# Patient Record
Sex: Female | Born: 1970 | Hispanic: Yes | Marital: Single | Smoking: Never smoker
Health system: Southern US, Community
[De-identification: ages and names within clinical notes are randomized; demographics above are authoritative.]

## PROBLEM LIST (undated history)

## (undated) DIAGNOSIS — I10 Essential (primary) hypertension: Secondary | ICD-10-CM

---

## 2014-06-16 HISTORY — PX: KNEE SURGERY: SHX244

## 2017-10-19 ENCOUNTER — Other Ambulatory Visit: Payer: Self-pay

## 2017-10-19 ENCOUNTER — Emergency Department (HOSPITAL_COMMUNITY)
Admission: EM | Admit: 2017-10-19 | Discharge: 2017-10-19 | Disposition: A | Discharge status: Home / Self Care | Payer: No Typology Code available for payment source | Attending: Emergency Medicine | Admitting: Emergency Medicine

## 2017-10-19 ENCOUNTER — Encounter (HOSPITAL_COMMUNITY): Payer: Self-pay | Admitting: Obstetrics and Gynecology

## 2017-10-19 ENCOUNTER — Emergency Department (HOSPITAL_COMMUNITY): Payer: No Typology Code available for payment source

## 2017-10-19 DIAGNOSIS — M542 Cervicalgia: Secondary | ICD-10-CM | POA: Diagnosis not present

## 2017-10-19 DIAGNOSIS — M25511 Pain in right shoulder: Secondary | ICD-10-CM | POA: Diagnosis not present

## 2017-10-19 DIAGNOSIS — M25561 Pain in right knee: Secondary | ICD-10-CM | POA: Diagnosis not present

## 2017-10-19 DIAGNOSIS — Y939 Activity, unspecified: Secondary | ICD-10-CM | POA: Insufficient documentation

## 2017-10-19 DIAGNOSIS — Y929 Unspecified place or not applicable: Secondary | ICD-10-CM | POA: Diagnosis not present

## 2017-10-19 DIAGNOSIS — M25562 Pain in left knee: Secondary | ICD-10-CM | POA: Diagnosis not present

## 2017-10-19 DIAGNOSIS — I10 Essential (primary) hypertension: Secondary | ICD-10-CM | POA: Diagnosis not present

## 2017-10-19 DIAGNOSIS — Z041 Encounter for examination and observation following transport accident: Secondary | ICD-10-CM | POA: Diagnosis present

## 2017-10-19 DIAGNOSIS — Y999 Unspecified external cause status: Secondary | ICD-10-CM | POA: Insufficient documentation

## 2017-10-19 HISTORY — DX: Essential (primary) hypertension: I10

## 2017-10-19 LAB — URINALYSIS, ROUTINE W REFLEX MICROSCOPIC
BACTERIA UA: NONE SEEN
Bilirubin Urine: NEGATIVE
Glucose, UA: NEGATIVE mg/dL
Ketones, ur: NEGATIVE mg/dL
Leukocytes, UA: NEGATIVE
Nitrite: NEGATIVE
Protein, ur: NEGATIVE mg/dL
SPECIFIC GRAVITY, URINE: 1.01 (ref 1.005–1.030)
pH: 6 (ref 5.0–8.0)

## 2017-10-19 LAB — POC URINE PREG, ED: PREG TEST UR: NEGATIVE

## 2017-10-19 MED ORDER — METHOCARBAMOL 500 MG PO TABS: 500.0000 mg | ORAL_TABLET | Freq: Every evening | ORAL | 0 refills | Status: AC | PRN

## 2017-10-19 MED ORDER — NAPROXEN 500 MG PO TABS: 500.0000 mg | ORAL_TABLET | Freq: Two times a day (BID) | ORAL | 0 refills | Status: AC

## 2017-10-19 MED ORDER — HYDROCODONE-ACETAMINOPHEN 5-325 MG PO TABS
1.0000 | ORAL_TABLET | Freq: Once | ORAL | Status: AC
  Administered 2017-10-19: 1 via ORAL
  Filled 2017-10-19: qty 1

## 2017-10-19 NOTE — ED Notes (Signed)
Pt assisted to Bathroom to provide urinalysis.
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
## 2017-10-19 NOTE — Discharge Instructions (Addendum)
Toma naproxen 2 veces cada dia con comida.  [place] robaxin para relajar los musculos. Ten cuidado, puede causar cansado.  El dolor va a continua por algunos dias. Debe empezar a Programme researcher, broadcasting/film/video.  Regresa a la sala de emergencia si no puede ver, si esta vomitando, si el dolor peoran mucho, si no puede mover la pierna o brazo, o con otros sinmas nuevos.   Take naproxen 2 times a day with meals.  Do not take other anti-inflammatories at the same time open (Advil, Motrin, ibuprofen, Aleve). You may supplement with Tylenol if you need further pain control. Use robaxin as needed for muscle stiffness or soreness.  Have caution, this may make you tired or groggy.  Do not drive or operate heavy machinery while taking this medicine. Use ice packs or heating pads if this helps control your pain. You likely have continued muscle stiffness and soreness over the next couple days.   Return to the emergency room if you develop vision changes, vomiting, slurred speech, numbness, loss of bowel or bladder control, or any new or worsening symptoms.

## 2017-10-19 NOTE — ED Notes (Signed)
Patient transported to CT 
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
## 2024-04-26 NOTE — Progress Notes (Incomplete)
   New Patient Pulmonology Office Visit   Subjective:  Patient ID: Courtney Chase, female    DOB: 06-Feb-1971  MRN: [id number]  Referred by: No ref. provider found  CC: No chief complaint on file.   HPI Courtney Chase is a 53 y.o. female with ***   {PULM QUESTIONNAIRES (Optional):33196}  ROS  Allergies: Patient has no allergy information on record.  Current Outpatient Medications:    methocarbamol  (ROBAXIN ) 500 MG tablet, Take 1 tablet (500 mg total) by mouth at bedtime as needed for muscle spasms., Disp: 10 tablet, Rfl: 0   naproxen  (NAPROSYN ) 500 MG tablet, Take 1 tablet (500 mg total) by mouth 2 (two) times daily with a meal., Disp: 30 tablet, Rfl: 0 Past Medical History:  Diagnosis Date   Hypertension    Past Surgical History:  Procedure Laterality Date   KNEE SURGERY Left 2016   No family history on file. Social History   Socioeconomic History   Marital status: Single    Spouse name: Not on file   Number of children: Not on file   Years of education: Not on file   Highest education level: Not on file  Occupational History   Not on file  Tobacco Use   Smoking status: Never   Smokeless tobacco: Never  Substance and Sexual Activity   Alcohol use: Never   Drug use: Never   Sexual activity: Yes  Other Topics Concern   Not on file  Social History Narrative   Not on file   Social Drivers of Health   Financial Resource Strain: Not on file  Food Insecurity: Not on file  Transportation Needs: Not on file  Physical Activity: Not on file  Stress: Not on file  Social Connections: Not on file  Intimate Partner Violence: Not on file       Objective:  There were no vitals taken for this visit. {Pulm Vitals (Optional):32837}  Physical Exam  Diagnostic Review:  {Labs (Optional):32838}     Assessment & Plan:   Assessment & Plan    No follow-ups on file.    Marny Patch, MD Pulmonary and Critical Care Medicine [facility]  Pulmonary Care

## 2024-04-27 ENCOUNTER — Ambulatory Visit (INDEPENDENT_AMBULATORY_CARE_PROVIDER_SITE_OTHER): Payer: Self-pay

## 2024-04-27 VITALS — BP 142/80 | HR 82 | Temp 97.8°F | Ht 62.0 in | Wt 254.6 lb

## 2024-04-27 DIAGNOSIS — J4520 Mild intermittent asthma, uncomplicated: Secondary | ICD-10-CM

## 2024-04-27 DIAGNOSIS — R0600 Dyspnea, unspecified: Secondary | ICD-10-CM

## 2024-04-27 MED ORDER — BENZONATATE 200 MG PO CAPS: 200.0000 mg | ORAL_CAPSULE | Freq: Three times a day (TID) | ORAL | 0 refills | Status: AC | PRN

## 2024-04-27 MED ORDER — ALBUTEROL SULFATE HFA 108 (90 BASE) MCG/ACT IN AERS: 1.0000 | INHALATION_SPRAY | Freq: Four times a day (QID) | RESPIRATORY_TRACT | 6 refills | Status: AC | PRN

## 2024-04-27 MED ORDER — FLUTICASONE-SALMETEROL 115-21 MCG/ACT IN AERO: 2.0000 | INHALATION_SPRAY | Freq: Two times a day (BID) | RESPIRATORY_TRACT | 6 refills | Status: AC

## 2024-04-27 NOTE — Patient Instructions (Addendum)
 Estimada Sra: Courtney Chase,   Sus sintomas estan probablement asociados a asma. Le recomiendo lo siguiente:  -Advair 2 inhalaciones 2 veces al dia -Albuterol 1 inhalacion cuando sienta silbido o falta para respirar.  -Para la tos Tessalon 1 pastilla 3 veces al dia, por 10 dias.  -Ordene una funcion pulmonar -Xray de sus pulmones.  La veo en 2-3 meses.   INHALER INSTRUCTIONS: To use the inhaler you follow these steps: Prime the inhaler according to instructions (which means waste between 1-4 doses before next use).  Follow package insert Shake the inhaler before each use Exhale completely by blowing all the air out of your lungs Seal your mouth around the inhaler Press down on the canister then inhale SLOW and STEADY until your fill your lungs completely. Hold the breath for 6-10 seconds Gently exhale Wait 60 seconds then repeat steps 2-8. Rinse the mouth after each use to prevent thrush  Your pharmacist can also review proper technique if you have any remaining questions. Let me know if the cost is too high, your insurance may be able to recommend a more affordable option for you.

## 2024-07-20 ENCOUNTER — Ambulatory Visit: Payer: Self-pay

## 2024-07-20 DIAGNOSIS — J4520 Mild intermittent asthma, uncomplicated: Secondary | ICD-10-CM

## 2024-07-20 DIAGNOSIS — R0600 Dyspnea, unspecified: Secondary | ICD-10-CM
